# Patient Record
Sex: Male | Born: 1990 | Race: Black or African American | Hispanic: No | Marital: Single | Smoking: Never smoker
Health system: Southern US, Community
[De-identification: ages and names within clinical notes are randomized; demographics above are authoritative.]

---

## 2012-07-27 ENCOUNTER — Emergency Department (HOSPITAL_COMMUNITY): Payer: BC Managed Care – PPO

## 2012-07-27 ENCOUNTER — Emergency Department (HOSPITAL_COMMUNITY)
Admission: EM | Admit: 2012-07-27 | Discharge: 2012-07-28 | Disposition: A | Discharge status: Home / Self Care | Payer: BC Managed Care – PPO | Attending: Emergency Medicine | Admitting: Emergency Medicine

## 2012-07-27 ENCOUNTER — Encounter (HOSPITAL_COMMUNITY): Payer: Self-pay | Admitting: Emergency Medicine

## 2012-07-27 DIAGNOSIS — S9306XA Dislocation of unspecified ankle joint, initial encounter: Secondary | ICD-10-CM | POA: Insufficient documentation

## 2012-07-27 DIAGNOSIS — Y9367 Activity, basketball: Secondary | ICD-10-CM | POA: Insufficient documentation

## 2012-07-27 DIAGNOSIS — X500XXA Overexertion from strenuous movement or load, initial encounter: Secondary | ICD-10-CM | POA: Insufficient documentation

## 2012-07-27 DIAGNOSIS — S9304XA Dislocation of right ankle joint, initial encounter: Secondary | ICD-10-CM

## 2012-07-27 DIAGNOSIS — Y92838 Other recreation area as the place of occurrence of the external cause: Secondary | ICD-10-CM | POA: Insufficient documentation

## 2012-07-27 DIAGNOSIS — Y9239 Other specified sports and athletic area as the place of occurrence of the external cause: Secondary | ICD-10-CM | POA: Insufficient documentation

## 2012-07-27 MED ORDER — SODIUM CHLORIDE 0.9 % IV BOLUS (SEPSIS)
1000.0000 mL | Freq: Once | INTRAVENOUS | Status: AC
  Administered 2012-07-27: 1000 mL via INTRAVENOUS

## 2012-07-27 MED ORDER — MORPHINE SULFATE 4 MG/ML IJ SOLN
4.0000 mg | Freq: Once | INTRAMUSCULAR | Status: AC
  Administered 2012-07-27: 4 mg via INTRAVENOUS
  Filled 2012-07-27: qty 1

## 2012-07-27 MED ORDER — ETOMIDATE 2 MG/ML IV SOLN: 0.3000 mg/kg | Freq: Once | INTRAVENOUS | Status: DC

## 2012-07-27 MED ORDER — ETOMIDATE 2 MG/ML IV SOLN
0.3000 mg/kg | Freq: Once | INTRAVENOUS | Status: AC
  Administered 2012-07-28: 21.78 mg via INTRAVENOUS
  Filled 2012-07-27: qty 10

## 2012-07-27 NOTE — ED Notes (Addendum)
Pt arrived via EMS.  Pt was playing basketball and came down from a rebound and twisted over his right foot.  Ankle deformity is noted.  A small laceration approximately 2mm on the outside of the ankle is noted.  No protrusion of bone is seen.  Per EMS 150 mcg of fentanyl pushed for pain.

## 2012-07-27 NOTE — ED Notes (Signed)
YNW:[id number]<[id number]> Expected date:<BR> Expected time:<BR> Means of arrival:<BR> Comments:<BR> EMS/21 yo male with fracture RLE-splint and IV pain meds-playing basketball

## 2012-07-27 NOTE — ED Provider Notes (Signed)
History     CSN: [id number]  Arrival date & time 07/27/12  2242   First MD Initiated Contact with Patient 07/27/12 2245      Chief Complaint  Patient presents with  . Leg Injury    (Consider location/radiation/quality/duration/timing/severity/associated sxs/prior treatment) HPI Comments: 22 y/o male presents to the ED with an injury to his right ankle s/p jumping up while playing basketball and landing on his foot with his ankle twisted in causing an immediate "crack". EMS gave him fentanyl which decreased his pain from 10/10 to 7/10. Per EMS no open wound but obvious deformity.  The history is provided by the patient.    No past medical history on file.  No past surgical history on file.  No family history on file.  History  Substance Use Topics  . Smoking status: Not on file  . Smokeless tobacco: Not on file  . Alcohol Use: Not on file      Review of Systems  Musculoskeletal:       Positive for right ankle pain.  Neurological: Negative for numbness.  All other systems reviewed and are negative.    Allergies  Review of patient's allergies indicates not on file.  Home Medications  No current outpatient prescriptions on file.  BP 134/74  Pulse 94  Temp(Src) 97.8 F (36.6 C) (Oral)  Resp 16  SpO2 96%  Physical Exam  Nursing note and vitals reviewed. Constitutional: He is oriented to person, place, and time. He appears well-developed and well-nourished. He appears distressed.  HENT:  Head: Normocephalic and atraumatic.  Mouth/Throat: Oropharynx is clear and moist.  Eyes: Conjunctivae are normal.  Neck: Normal range of motion.  Cardiovascular: Normal rate, regular rhythm, normal heart sounds and intact distal pulses.   Pulses:      Dorsalis pedis pulses are 2+ on the right side.       Posterior tibial pulses are 2+ on the right side.  Capillary refill < 3 seconds.  Pulmonary/Chest: Effort normal and breath sounds normal.  Musculoskeletal:  Right  ankle internally rotated. Obvious deformity. Extreme tenderness to palpation.  Neurological: He is alert and oriented to person, place, and time. No sensory deficit.  Skin: Skin is warm and dry.  1 cm superficial abrasion over right lateral malleolus.  Psychiatric: His speech is normal. His mood appears anxious.    ED Course  Procedures (including critical care time)  Labs Reviewed - No data to display Dg Tibia/fibula Right  07/27/2012  *RADIOLOGY REPORT*  Clinical Data: Ankle dislocation.  RIGHT TIBIA AND FIBULA - 2 VIEW  Comparison: None.  Findings: The tibia and fibula are intact.  There is a posterior medial dislocation of the ankle joint.  IMPRESSION: Ankle joint dislocation.  No fractures.   Original Report Authenticated By: Francene Boyers, M.D.    Dg Ankle 2 Views Right  07/27/2012  *RADIOLOGY REPORT*  Clinical Data: Pain and deformity after injury playing basketball.  RIGHT ANKLE - 2 VIEW  Comparison: None.  Findings: There is posterior medial dislocation at the ankle joint. No visible fractures.  IMPRESSION: Posterior medial dislocation of the ankle joint.   Original Report Authenticated By: Francene Boyers, M.D.      1. Ankle dislocation, right, initial encounter       MDM  22 y/o male with obviously deformed right ankle. Small abrasion present, but no open injury. Neurovascularly intact. Obtaining xrays. Morphine for pain. NPO. 12:41 AM Posterior medial ankle dislocation reduced by Dr. Read Drivers under procedural sedation. Post-reduction  xrays with ankle back in place. Dr. Charlann Boxer will see patient in office tomorrow. Percocet for pain. Splint applied and crutches given.      Trevor Mace, PA-C 07/28/12 0042  Trevor Mace, PA-C 07/28/12 [phone]

## 2012-07-28 ENCOUNTER — Emergency Department (HOSPITAL_COMMUNITY): Payer: BC Managed Care – PPO

## 2012-07-28 MED ORDER — ETOMIDATE 2 MG/ML IV SOLN
INTRAVENOUS | Status: AC | PRN
  Administered 2012-07-28: 10.89 mg via INTRAVENOUS

## 2012-07-28 MED ORDER — FENTANYL CITRATE 0.05 MG/ML IJ SOLN
100.0000 ug | Freq: Once | INTRAMUSCULAR | Status: AC
  Administered 2012-07-28: 100 ug via INTRAVENOUS
  Filled 2012-07-28: qty 2

## 2012-07-28 MED ORDER — OXYCODONE-ACETAMINOPHEN 5-325 MG PO TABS: 1.0000 | ORAL_TABLET | Freq: Four times a day (QID) | ORAL | Status: AC | PRN

## 2012-07-28 NOTE — ED Provider Notes (Signed)
Medical screening examination/treatment/procedure(s) were conducted as a shared visit with non-physician practitioner(s) and myself.  I personally evaluated the patient during the encounter  Obvious deformity of right ankle with overlying abrasion.  Pre-sedation "time-out," performed about 0005.  Provider confirms review of the nurses' note, allergies, medications, PMH, pre-induction vital signs with pulse oximetry, pain level, pertinent labs, imaging, and ECG (as applicable) and patient condition satisfactory for commencing with order for sedation and procedure.  Agents used in sedation:  etominate.   Procedural sedation Performed by: Hanley Seamen Consent: Verbal consent obtained. Risks and benefits: risks, benefits and alternatives were discussed Required items: required blood products, implants, devices, and special equipment available Patient identity confirmed: arm band and provided demographic data Time out: Immediately prior to procedure a "time out" was called to verify the correct patient, procedure, equipment, support staff and site/side marked as required.  Sedation type: moderate (conscious) sedation NPO time confirmed and considedered  Sedatives: ETOMIDATE  Physician Time at Bedside: 20 minutes  Vitals: Vital signs were monitored during sedation. Cardiac Monitor, pulse oximeter Patient tolerance: Patient tolerated the procedure well with no immediate complications. Comments: Pt with uneventful recovered. Returned to pre-procedural sedation baseline  CLOSED REDUCTION and SPLINTING After adequate sedation was obtained the patient's right ankle dislocation was reduced by gentle traction. The ankle was then stabilized with the application of a short leg posterior splint and stirrups. The patient tolerated this well and there were no immediate complications. The right foot remains neurovascularly intact. Near-anatomic alignment was confirmed by followup x-ray.  Patient tolerated  procedure and procedural sedation component as expected without apparent immediate complications.  Physician confirms procedural medication orders as administered, patient was assessed by physician post-procedure, and confirms post-sedation plan of care and disposition.   Hanley Seamen, MD 07/28/12 [phone]

## 2012-12-24 ENCOUNTER — Other Ambulatory Visit: Payer: Self-pay | Admitting: Emergency Medicine

## 2012-12-24 ENCOUNTER — Ambulatory Visit (INDEPENDENT_AMBULATORY_CARE_PROVIDER_SITE_OTHER): Payer: BC Managed Care – PPO | Admitting: Emergency Medicine

## 2012-12-24 ENCOUNTER — Ambulatory Visit (HOSPITAL_COMMUNITY)
Admission: RE | Admit: 2012-12-24 | Discharge: 2012-12-24 | Disposition: A | Discharge status: Home / Self Care | Payer: BC Managed Care – PPO | Source: Ambulatory Visit | Attending: Emergency Medicine | Admitting: Emergency Medicine

## 2012-12-24 VITALS — BP 112/80 | HR 86 | Temp 98.0°F | Resp 16 | Ht 70.0 in | Wt 138.0 lb

## 2012-12-24 DIAGNOSIS — R609 Edema, unspecified: Secondary | ICD-10-CM

## 2012-12-24 DIAGNOSIS — M79609 Pain in unspecified limb: Secondary | ICD-10-CM

## 2012-12-24 DIAGNOSIS — M7989 Other specified soft tissue disorders: Secondary | ICD-10-CM | POA: Insufficient documentation

## 2012-12-24 LAB — POCT CBC
Granulocyte percent: 49.6 %G (ref 37–80)
MID (cbc): 0.5 (ref 0–0.9)
MPV: 8.8 fL (ref 0–99.8)
POC MID %: 10.4 %M (ref 0–12)
Platelet Count, POC: 257 10*3/uL (ref 142–424)
RBC: 5.15 M/uL (ref 4.69–6.13)

## 2012-12-24 LAB — POCT URINALYSIS DIPSTICK
Bilirubin, UA: NEGATIVE
Blood, UA: NEGATIVE
Nitrite, UA: NEGATIVE
pH, UA: 5.5

## 2012-12-24 LAB — COMPREHENSIVE METABOLIC PANEL
ALT: 203 U/L — ABNORMAL HIGH (ref 0–53)
AST: 154 U/L — ABNORMAL HIGH (ref 0–37)
Albumin: 4.4 g/dL (ref 3.5–5.2)
Calcium: 9.8 mg/dL (ref 8.4–10.5)
Chloride: 104 mEq/L (ref 96–112)
Creat: 1.35 mg/dL (ref 0.50–1.35)
Potassium: 4.4 mEq/L (ref 3.5–5.3)

## 2012-12-24 NOTE — Addendum Note (Signed)
Addended by: Areta Haber B on: 12/24/2012 03:11 PM   Modules accepted: Orders

## 2012-12-24 NOTE — Progress Notes (Signed)
[facility] [address] [place] [address] [phone]  Date:  12/24/2012   Name:  Donald Shelton   DOB:  23-May-1990   MRN:  [id number]  PCP:  No primary provider on file.    Chief Complaint: Foot Swelling   History of Present Illness:  Donald Shelton is a 22 y.o. very pleasant male patient who presents with the following:  Says that he had bilateral 2 plus edema of both calves associated with no immobilization or travel.  No history of injury.  Says it was present for about 10 days, improved and has been resolved for about a week.  In college.  No medical problems or medications.  No improvement with over the counter medications or other home remedies. Denies other complaint or health concern today.   There are no active problems to display for this patient.   History reviewed. No pertinent past medical history.  History reviewed. No pertinent past surgical history.  History  Substance Use Topics  . Smoking status: Never Smoker   . Smokeless tobacco: Not on file  . Alcohol Use: No    Family History  Problem Relation Age of Onset  . Hypertension Mother   . Hypertension Father     No Known Allergies  Medication list has been reviewed and updated.  Current Outpatient Prescriptions on File Prior to Visit  Medication Sig Dispense Refill  . aspirin-acetaminophen-caffeine (EXCEDRIN MIGRAINE) 250-250-65 MG per tablet Take 1 tablet by mouth every 6 (six) hours as needed for pain.      Marland Kitchen oxyCODONE-acetaminophen (PERCOCET) 5-325 MG per tablet Take 1-2 tablets by mouth every 6 (six) hours as needed for pain.  20 tablet  0   No current facility-administered medications on file prior to visit.    Review of Systems:  As per HPI, otherwise negative.    Physical Examination: Filed Vitals:   12/24/12 1238  BP: 112/80  Pulse: 86  Temp: 98 F (36.7 C)  Resp: 16   Filed Vitals:   12/24/12 1238  Height: 5\' 10"  (1.778 m)  Weight: 138 lb (62.596 kg)    Body mass index is 19.8 kg/(m^2). Ideal Body Weight: Weight in (lb) to have BMI = 25: 173.9  GEN: WDWN, NAD, Non-toxic, A & O x 3 HEENT: Atraumatic, Normocephalic. Neck supple. No masses, No LAD. Ears and Nose: No external deformity. CV: RRR, No M/G/R. No JVD. No thrill. No extra heart sounds. PULM: CTA B, no wheezes, crackles, rhonchi. No retractions. No resp. distress. No accessory muscle use. ABD: S, NT, ND, +BS. No rebound. No HSM. EXTR: No c/c/e  No calf tenderness NEURO Normal gait.  PSYCH: Normally interactive. Conversant. Not depressed or anxious appearing.  Calm demeanor.    Assessment and Plan: Peripheral edema by history   Signed,  Phillips Odor, MD

## 2012-12-24 NOTE — Progress Notes (Signed)
VASCULAR LAB PRELIMINARY  PRELIMINARY  PRELIMINARY  PRELIMINARY  Bilateral lower extremity venous Dopplers completed.    Preliminary report:  There is no DVT or SVT noted in the bilateral lower extremities.  Brailey Buescher, RVT 12/24/2012, 3:30 PM

## 2012-12-25 NOTE — Addendum Note (Signed)
Addended by: Carmelina Dane on: 12/25/2012 10:33 AM   Modules accepted: Orders

## 2012-12-26 ENCOUNTER — Telehealth: Payer: Self-pay | Admitting: Family Medicine

## 2012-12-26 LAB — HEPATITIS B SURFACE ANTIBODY, QUANTITATIVE: Hepatitis B-Post: 0.3 m[IU]/mL

## 2012-12-26 NOTE — Telephone Encounter (Signed)
Called [facility] to add on ACUTE HEP PANEL and HEP B ANTIBODY per Dr Dareen Piano

## 2012-12-28 LAB — HEPATITIS PANEL, ACUTE
HCV Ab: NEGATIVE
Hep A IgM: NEGATIVE

## 2012-12-28 NOTE — Addendum Note (Signed)
Addended by: Carmelina Dane on: 12/28/2012 10:26 AM   Modules accepted: Orders

## 2012-12-28 NOTE — Addendum Note (Signed)
Addended by: Carmelina Dane on: 12/28/2012 10:28 AM   Modules accepted: Orders

## 2013-01-01 ENCOUNTER — Ambulatory Visit (INDEPENDENT_AMBULATORY_CARE_PROVIDER_SITE_OTHER): Payer: BC Managed Care – PPO | Admitting: Family Medicine

## 2013-01-01 VITALS — BP 128/78 | HR 60 | Temp 97.9°F | Resp 18 | Ht 71.0 in | Wt 139.6 lb

## 2013-01-01 DIAGNOSIS — R05 Cough: Secondary | ICD-10-CM

## 2013-01-01 DIAGNOSIS — R51 Headache: Secondary | ICD-10-CM

## 2013-01-01 DIAGNOSIS — R7989 Other specified abnormal findings of blood chemistry: Secondary | ICD-10-CM

## 2013-01-01 LAB — POCT CBC
HCT, POC: 45.7 % (ref 43.5–53.7)
Hemoglobin: 14.6 g/dL (ref 14.1–18.1)
Lymph, poc: 1.3 (ref 0.6–3.4)
MCH, POC: 29.9 pg (ref 27–31.2)
MCHC: 31.9 g/dL (ref 31.8–35.4)
MCV: 93.4 fL (ref 80–97)
POC MID %: 7.6 %M (ref 0–12)
WBC: 5.2 10*3/uL (ref 4.6–10.2)

## 2013-01-01 LAB — COMPREHENSIVE METABOLIC PANEL
BUN: 16 mg/dL (ref 6–23)
CO2: 28 mEq/L (ref 19–32)
Calcium: 9.3 mg/dL (ref 8.4–10.5)
Creat: 1.21 mg/dL (ref 0.50–1.35)
Glucose, Bld: 79 mg/dL (ref 70–99)
Total Bilirubin: 0.5 mg/dL (ref 0.3–1.2)

## 2013-01-01 NOTE — Progress Notes (Signed)
Subjective:    Patient ID: Donald Shelton, male    DOB: 1990/09/12, 22 y.o.   MRN: [id number]  HPI Donald Shelton is a 22 y.o. male  Seen 12/24/12 with LE edema by history. None noted on exam per note with Dr. Dareen Piano. sent for LE doppler:  There is no DVT or SVT noted in the bilateral lower extremities. Labs below obtained. Elevated LFT's noted - plan for acute hep panel labs.   Hx reviewed - LE edema was off and on for 2 weeks prior to last ov, but none since that visit. No calf pain or leg pain.  HA 2 days ago, felt typical of migraine in past, vomited 3 times, no diarrhea. HA lasted about 4-5 days, then resolved yesterday. No abdominal pain, no rash.  Did have sore throat this week this past week, but none today. No fever.  Slight cough - past few days. Dry cough.  No eye color change, no change in urine color.   Acute hep panel noted - normal/negative.   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  No current sports. Exercise: weightlifting.  ? Sick contacts with some virus at school.   Results for orders placed in visit on 12/24/12  COMPREHENSIVE METABOLIC PANEL      Result Value Range   Sodium 138  135 - 145 mEq/L   Potassium 4.4  3.5 - 5.3 mEq/L   Chloride 104  96 - 112 mEq/L   CO2 31  19 - 32 mEq/L   Glucose, Bld 88  70 - 99 mg/dL   BUN 13  6 - 23 mg/dL   Creat 4.09  8.11 - 9.14 mg/dL   Total Bilirubin 1.1  0.3 - 1.2 mg/dL   Alkaline Phosphatase 56  39 - 117 U/L   AST 154 (*) 0 - 37 U/L   ALT 203 (*) 0 - 53 U/L   Total Protein 7.2  6.0 - 8.3 g/dL   Albumin 4.4  3.5 - 5.2 g/dL   Calcium 9.8  8.4 - 78.2 mg/dL  POCT CBC      Result Value Range   WBC 4.7  4.6 - 10.2 K/uL   Lymph, poc 1.9  0.6 - 3.4   POC LYMPH PERCENT 40.0  10 - 50 %L   MID (cbc) 0.5  0 - 0.9   POC MID % 10.4  0 - 12 %M   POC Granulocyte 2.3  2 - 6.9   Granulocyte percent 49.6  37 - 80 %G   RBC 5.15  4.69 - 6.13 M/uL   Hemoglobin 15.4  14.1 - 18.1 g/dL   HCT, POC 95.6  21.3 - 53.7 %   MCV 93.2  80 - 97  fL   MCH, POC 29.9  27 - 31.2 pg   MCHC 32.1  31.8 - 35.4 g/dL   RDW, POC 08.6     Platelet Count, POC 257  142 - 424 K/uL   MPV 8.8  0 - 99.8 fL  POCT URINALYSIS DIPSTICK      Result Value Range   Color, UA yellow     Clarity, UA clear     Glucose, UA neg     Bilirubin, UA neg     Ketones, UA neg     Spec Grav, UA >=1.030     Blood, UA neg     pH, UA 5.5     Protein, UA neg     Urobilinogen, UA 1.0     Nitrite,  UA neg     Leukocytes, UA Negative      Review of Systems  Constitutional: Negative for fever and chills.  Respiratory: Positive for cough. Negative for shortness of breath.   Cardiovascular: Positive for leg swelling (now resolved. ).  Gastrointestinal: Positive for nausea and vomiting (with HA - now resolved. ). Negative for abdominal pain.  Genitourinary: Negative for decreased urine volume and difficulty urinating.  Skin: Negative for color change and rash.  Hematological: Negative for adenopathy.    As above.     Objective:   Physical Exam  Vitals reviewed. Constitutional: He is oriented to person, place, and time. He appears well-developed and well-nourished.  HENT:  Head: Normocephalic and atraumatic.  Right Ear: Tympanic membrane, external ear and ear canal normal.  Left Ear: Tympanic membrane, external ear and ear canal normal.  Nose: No rhinorrhea.  Mouth/Throat: Oropharynx is clear and moist and mucous membranes are normal. No oropharyngeal exudate or posterior oropharyngeal erythema.  Eyes: Conjunctivae are normal. Pupils are equal, round, and reactive to light. No scleral icterus.  Neck: Neck supple.  No neck, axillary, epitrochlear, or inguinal LAD.   Cardiovascular: Normal rate, regular rhythm, normal heart sounds and intact distal pulses.   No murmur heard. Pulmonary/Chest: Effort normal and breath sounds normal. He has no wheezes. He has no rhonchi. He has no rales.  Abdominal: Soft. Normal appearance and bowel sounds are normal. He exhibits  no distension, no pulsatile liver and no ascites. There is no hepatosplenomegaly. There is no tenderness. There is no CVA tenderness, no tenderness at McBurney's point and negative Murphy's sign. No hernia.  Lymphadenopathy:    He has no cervical adenopathy.  Neurological: He is alert and oriented to person, place, and time.  Skin: Skin is warm and dry. No rash noted.  Psychiatric: He has a normal mood and affect. His behavior is normal.   Results for orders placed in visit on 01/01/13  POCT CBC      Result Value Range   WBC 5.2  4.6 - 10.2 K/uL   Lymph, poc 1.3  0.6 - 3.4   POC LYMPH PERCENT 25.3  10 - 50 %L   MID (cbc) 0.4  0 - 0.9   POC MID % 7.6  0 - 12 %M   POC Granulocyte 3.5  2 - 6.9   Granulocyte percent 67.1  37 - 80 %G   RBC 4.89  4.69 - 6.13 M/uL   Hemoglobin 14.6  14.1 - 18.1 g/dL   HCT, POC 14.7  82.9 - 53.7 %   MCV 93.4  80 - 97 fL   MCH, POC 29.9  27 - 31.2 pg   MCHC 31.9  31.8 - 35.4 g/dL   RDW, POC 56.2     Platelet Count, POC 265  142 - 424 K/uL   MPV 7.4  0 - 99.8 fL        Assessment & Plan:  Donald Shelton is a 22 y.o. male Elevated LFTs - Plan: Mono Donald Carl Virus), Comprehensive metabolic panel - no sign of jaundice, nontender abdomen, Acute hep panel noted. Suspected viral syndrome, including possible mono/cmv, but improved sx's currently. Will recheck CMP, EBV IGM., avoid hepatotoxins, and sx care, rtc precautions.   Cough - Plan: POCT CBC. Reassuring cbc.  Suspect viral syndrome as above.   Headache(784.0) - migraine typical with N/V, now resolved. Reassuring, nonfocal exam. rtc precautions.   Hx of LE edema - now resolved. rtc if recurs.  Patient Instructions  You should receive a call or letter about your lab results within the next week.  Your blood count in the office and exam are reassuring at this point.  Fluids, relative rest, avoid tylenol, and alcohol.  Recheck next week if not continuing to improve.  recheck if leg swelling  returns. Return to the clinic or go to the nearest emergency room if any of your symptoms worsen or new symptoms occur.

## 2013-01-01 NOTE — Patient Instructions (Signed)
You should receive a call or letter about your lab results within the next week.  Your blood count in the office and exam are reassuring at this point.  Fluids, relative rest, avoid tylenol, and alcohol.  Recheck next week if not continuing to improve.  recheck if leg swelling returns. Return to the clinic or go to the nearest emergency room if any of your symptoms worsen or new symptoms occur.

## 2013-01-02 NOTE — Progress Notes (Signed)
Please call and ask him to come in for labs.  thanks

## 2013-01-03 LAB — EPSTEIN-BARR VIRUS VCA ANTIBODY PANEL
EBV EA IgG: <5 U/mL
EBV NA IgG: <3 U/mL
EBV VCA IgG: <10 U/mL
EBV VCA IgM: <10 U/mL

## 2013-02-16 ENCOUNTER — Other Ambulatory Visit: Payer: Self-pay

## 2013-09-12 IMAGING — CR DG TIBIA/FIBULA 2V*R*
2 series · 2 of 2 positions shown · non-contrast
Comparison: None.

CLINICAL DATA: Ankle dislocation.

RIGHT TIBIA AND FIBULA - 2 VIEW

[x tib-fib ap right]
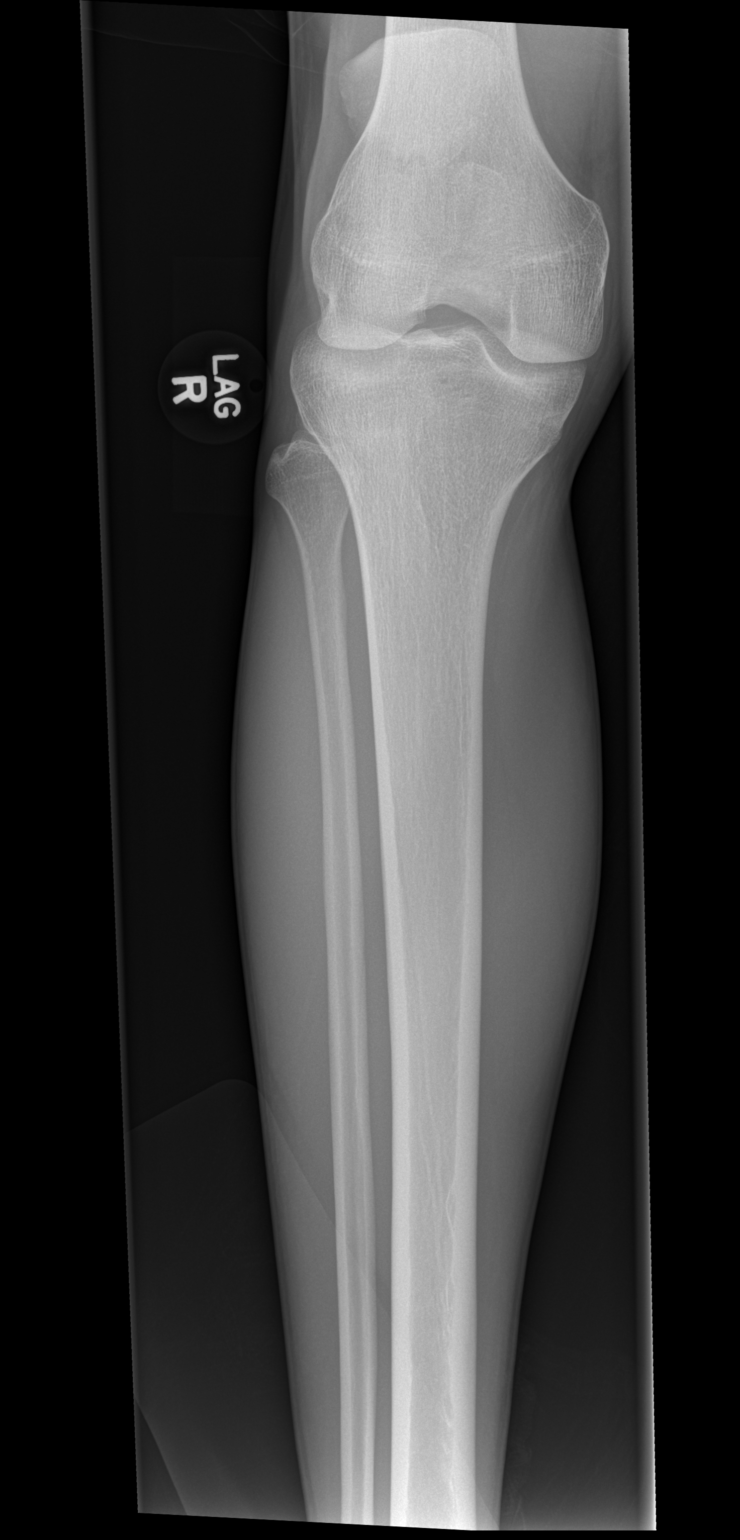

[x tib-fib lat right]
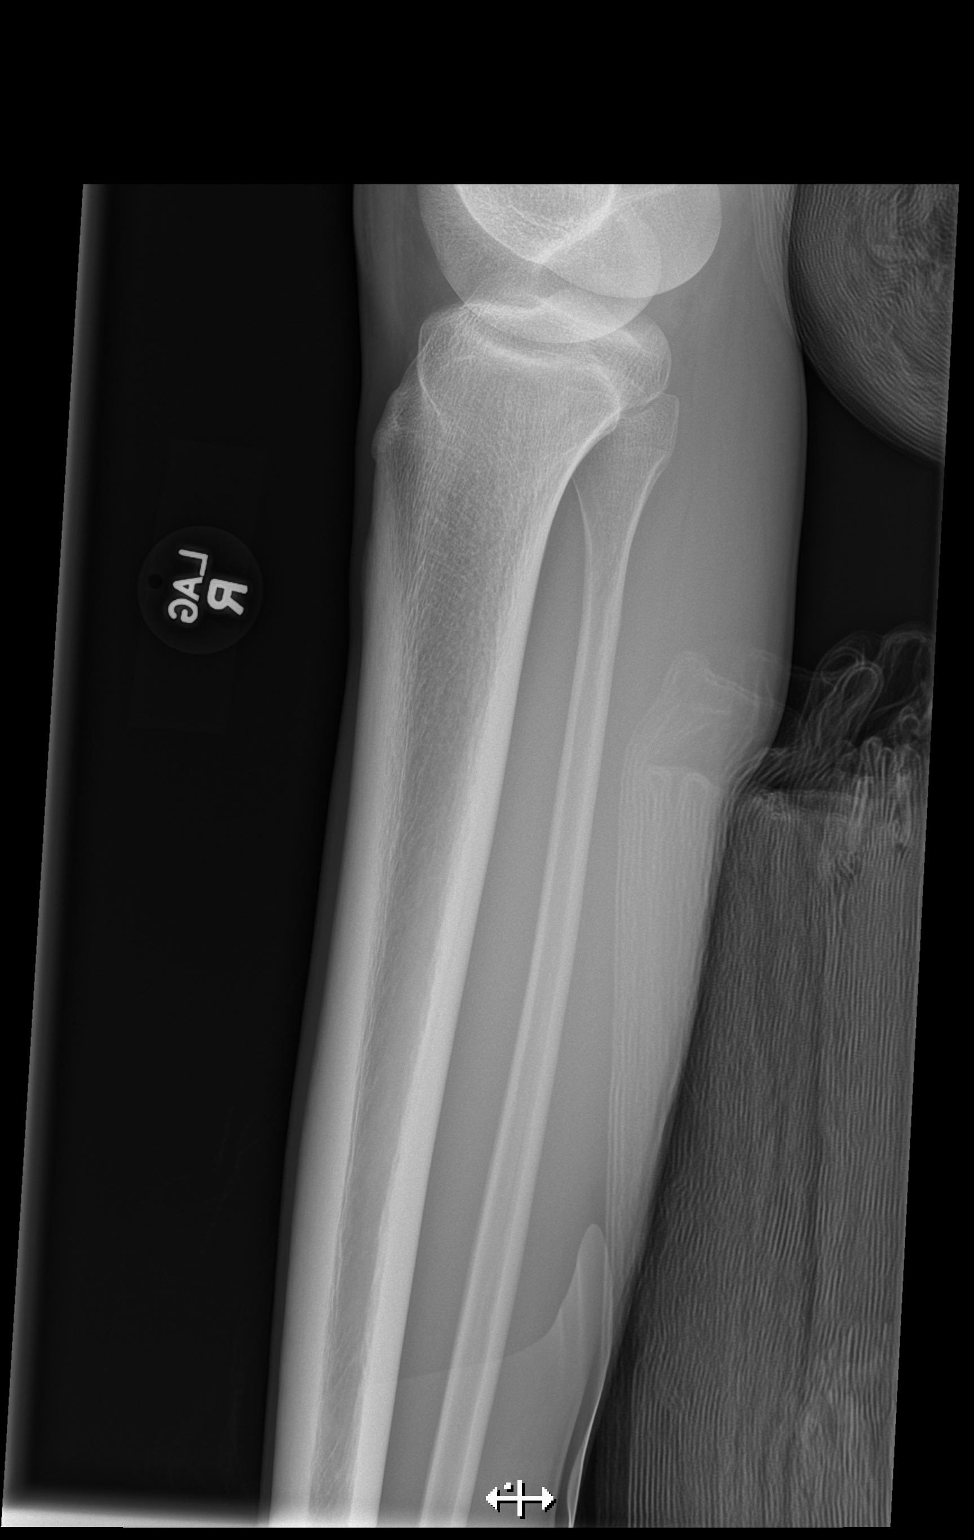

[2 of 2 positions shown; findings below may reference images not displayed]

FINDINGS: The tibia and fibula are intact.  There is a posterior
medial dislocation of the ankle joint.
IMPRESSION: Ankle joint dislocation.  No fractures.
